# Patient Record
Sex: Female | Born: 1995 | Race: White | Hispanic: No | Marital: Single | Smoking: Current every day smoker
Health system: Southern US, Community
[De-identification: ages and names within clinical notes are randomized; demographics above are authoritative.]

## PROBLEM LIST (undated history)

## (undated) DIAGNOSIS — F4310 Post-traumatic stress disorder, unspecified: Secondary | ICD-10-CM

---
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
## 2013-08-09 ENCOUNTER — Emergency Department
Admission: EM | Admit: 2013-08-09 | Discharge: 2013-08-09 | Discharge status: Absent without leave | Payer: Self-pay | Source: Home / Self Care

## 2014-09-19 IMAGING — CR DG ANKLE COMPLETE 3+V*L*
3 series · 3 of 3 positions shown · non-contrast
Comparison: None.

CLINICAL DATA: Fall with medial ankle pain.

LEFT ANKLE COMPLETE - 3+ VIEW

[view not recorded (1 of 3)]
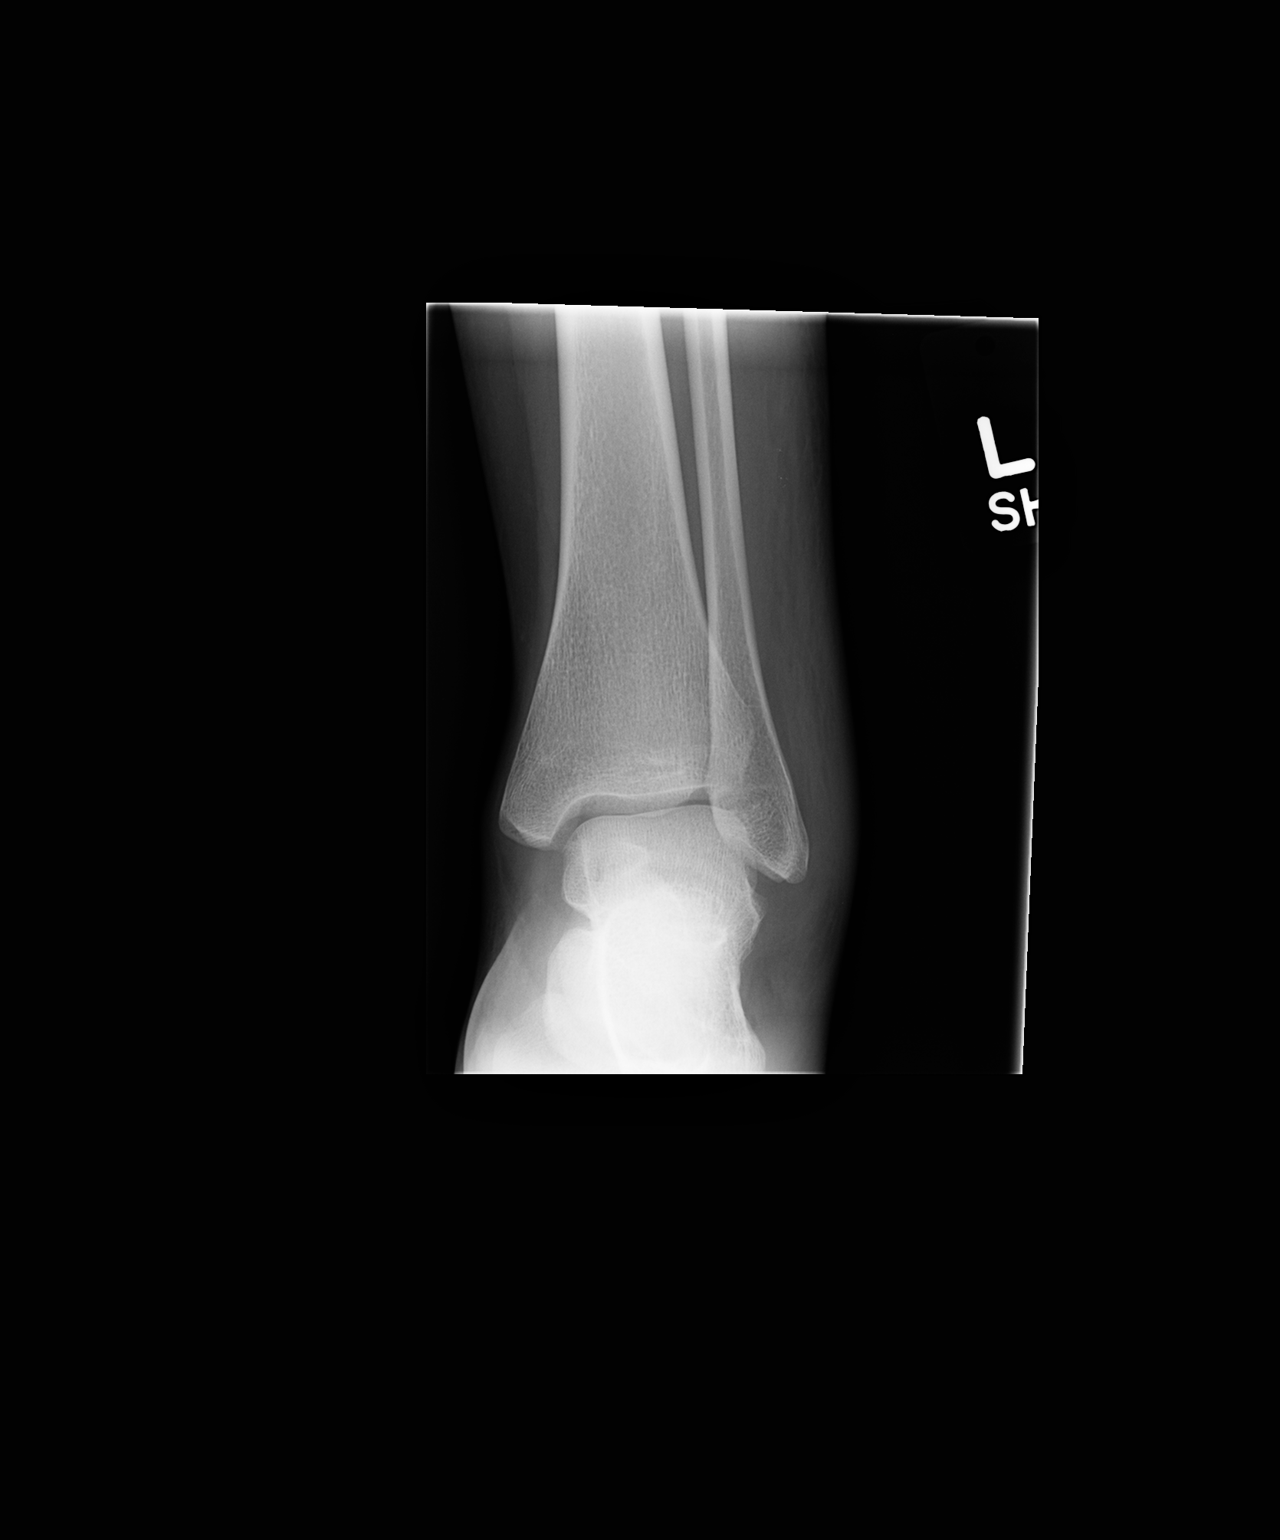

[view not recorded (2 of 3)]
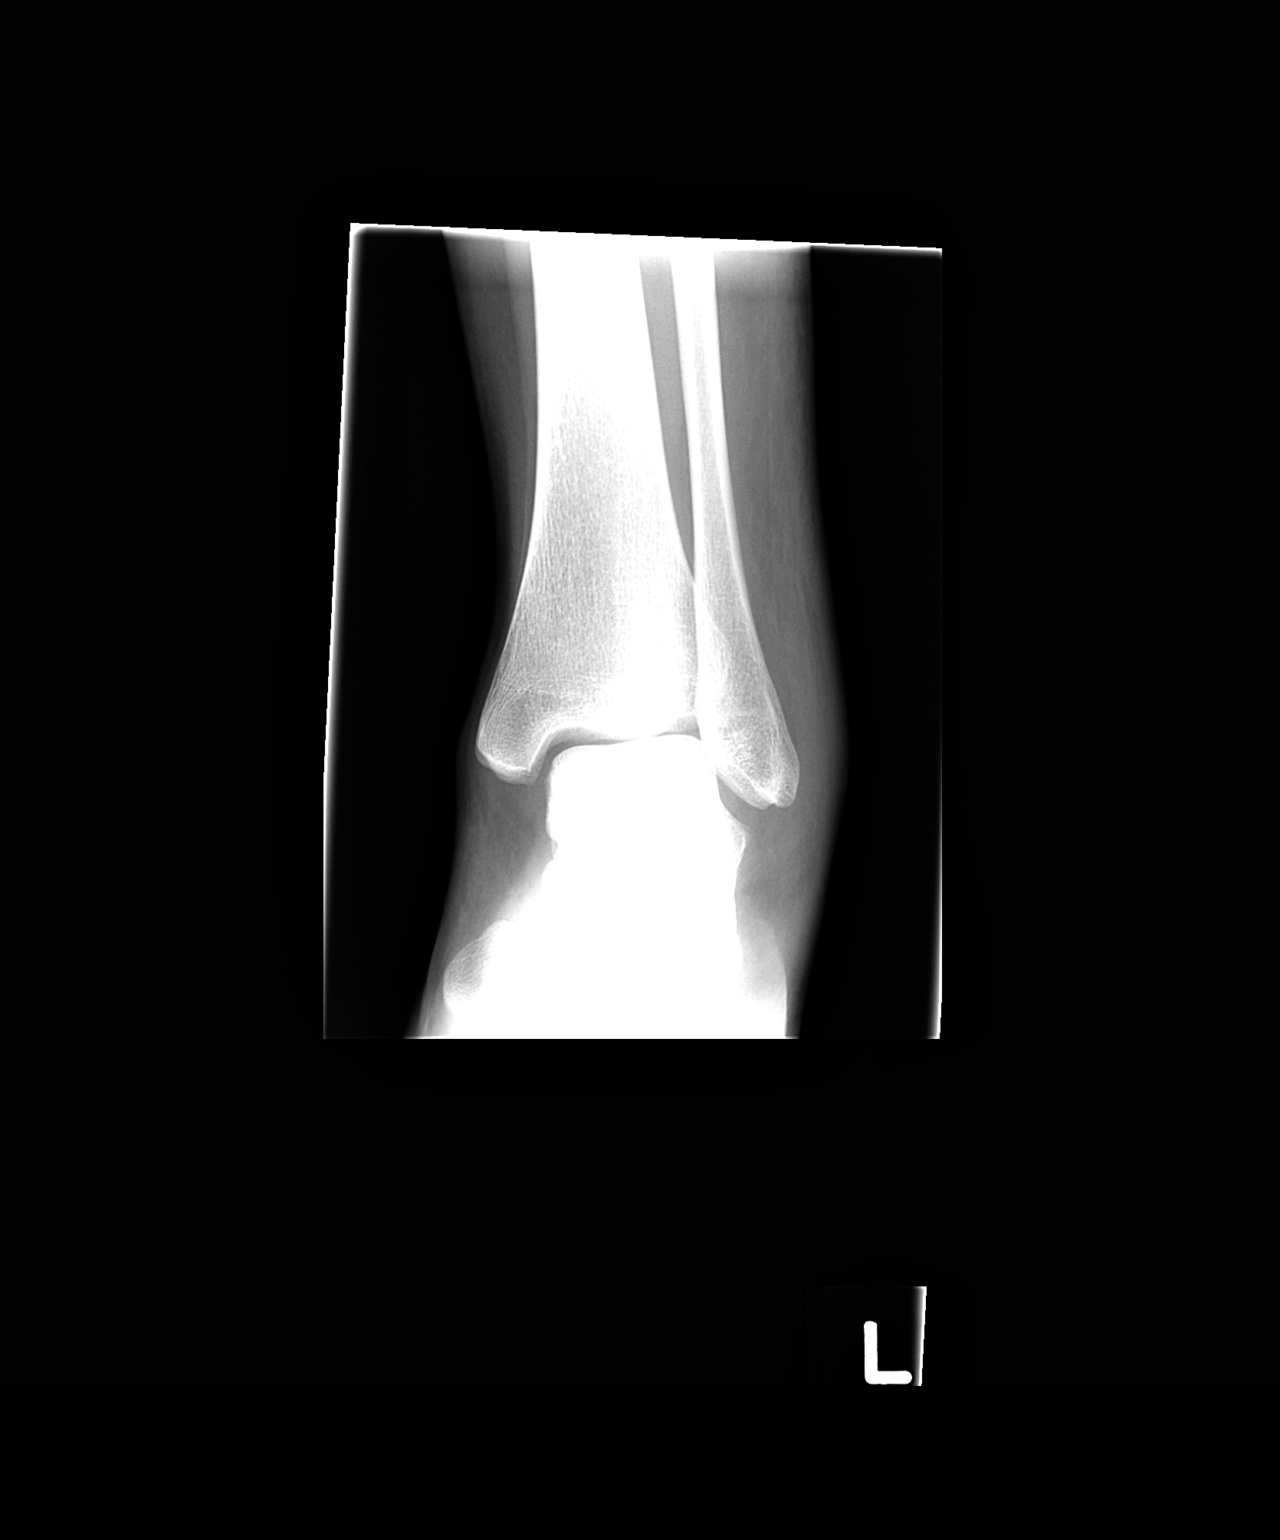

[view not recorded (3 of 3)]
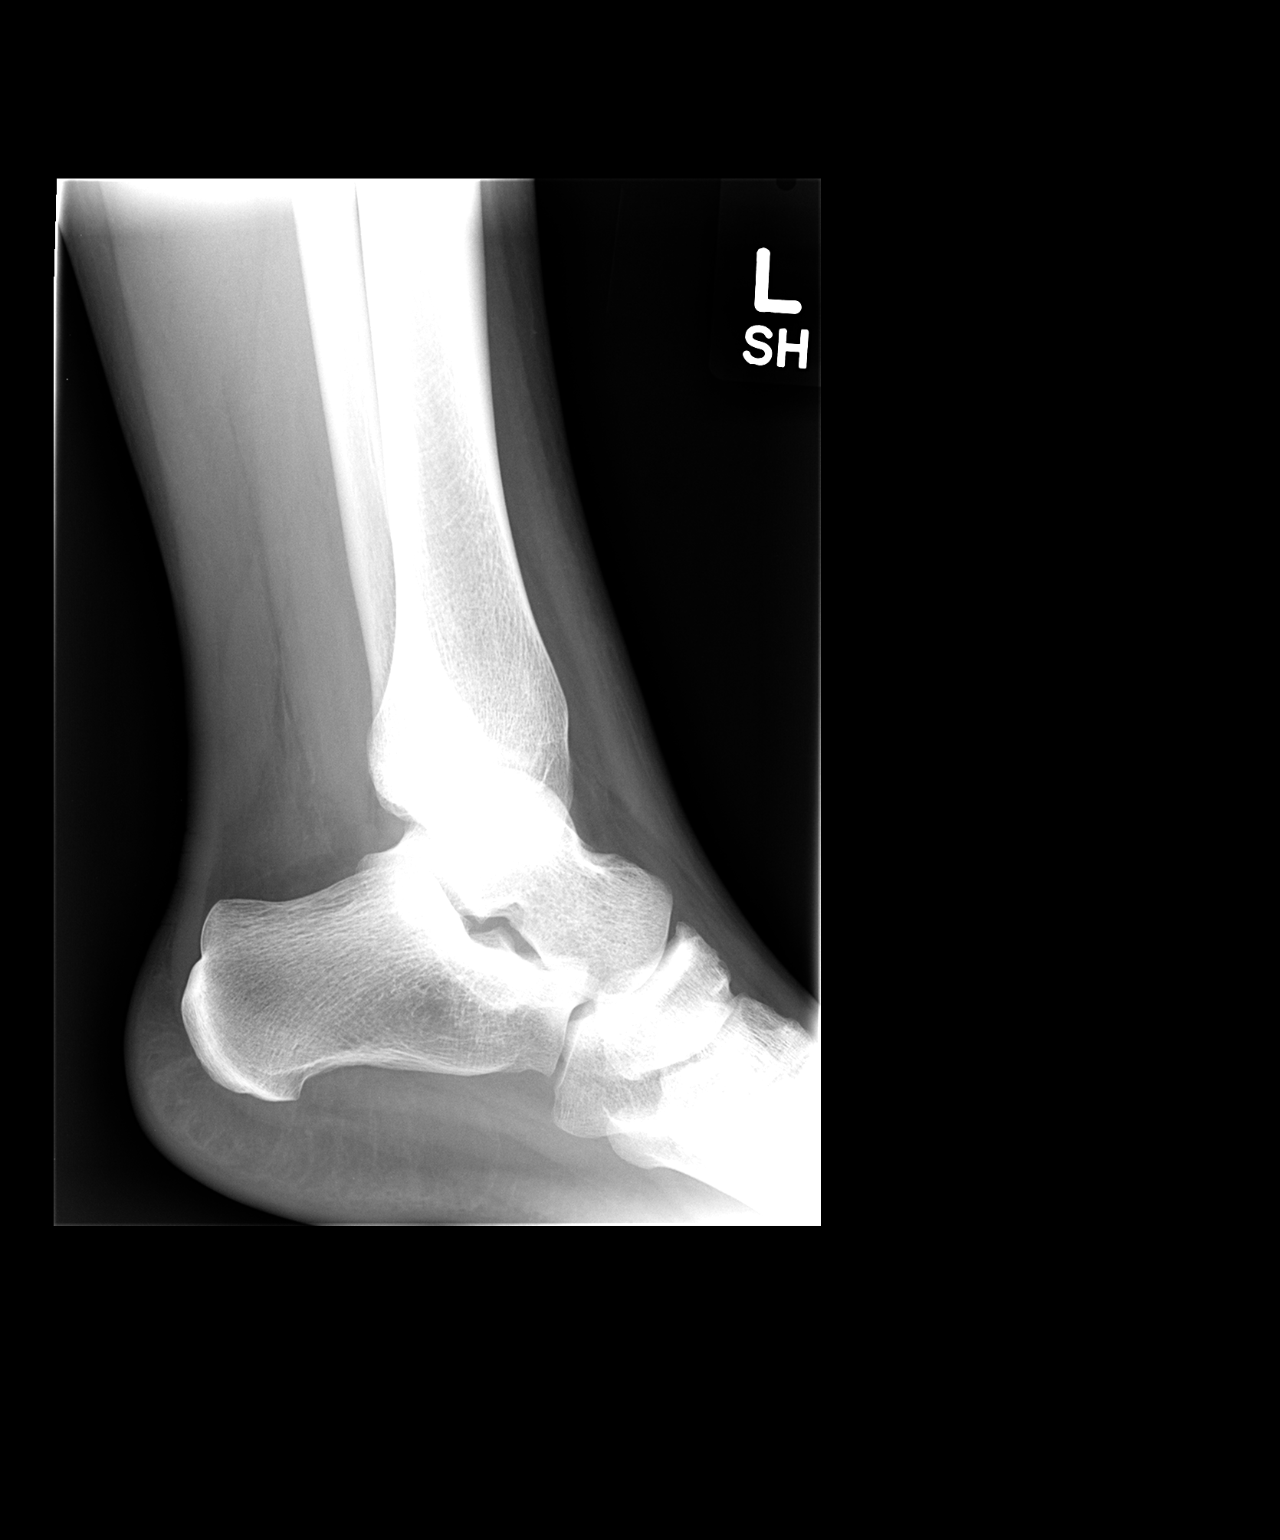

[3 of 3 positions shown; findings below may reference images not displayed]

FINDINGS: There is lateral soft tissue swelling.  No acute osseous
or joint abnormality.
IMPRESSION: Lateral soft tissue swelling without acute osseous or joint
abnormality.

## 2015-11-07 ENCOUNTER — Other Ambulatory Visit: Payer: Self-pay

## 2018-02-25 ENCOUNTER — Telehealth: Payer: Self-pay | Admitting: *Deleted

## 2018-02-25 NOTE — Telephone Encounter (Signed)
Patient's Primary Physician Office called inquiring about the referral that was sent to [place] and if an appointment was made. Informed the office that 2 attempted calls have been made to the patient to schedule an appointment. PCP office stated that they would note that in the patient's chart.

## 2021-09-07 ENCOUNTER — Encounter (HOSPITAL_COMMUNITY): Payer: Self-pay | Admitting: *Deleted

## 2021-09-07 ENCOUNTER — Other Ambulatory Visit: Payer: Self-pay

## 2021-09-07 ENCOUNTER — Emergency Department (HOSPITAL_COMMUNITY)
Admission: EM | Admit: 2021-09-07 | Discharge: 2021-09-07 | Disposition: A | Discharge status: Home / Self Care | Payer: BC Managed Care – PPO | Attending: Emergency Medicine | Admitting: Emergency Medicine

## 2021-09-07 DIAGNOSIS — F1110 Opioid abuse, uncomplicated: Secondary | ICD-10-CM | POA: Diagnosis present

## 2021-09-07 HISTORY — DX: Post-traumatic stress disorder, unspecified: F43.10

## 2021-09-07 LAB — URINALYSIS, ROUTINE W REFLEX MICROSCOPIC
Bilirubin Urine: NEGATIVE
Glucose, UA: NEGATIVE mg/dL
Ketones, ur: NEGATIVE mg/dL
Leukocytes,Ua: NEGATIVE
Nitrite: NEGATIVE
Protein, ur: NEGATIVE mg/dL
Specific Gravity, Urine: 1.019 (ref 1.005–1.030)
pH: 5 (ref 5.0–8.0)

## 2021-09-07 LAB — RAPID URINE DRUG SCREEN, HOSP PERFORMED
Amphetamines: NOT DETECTED
Barbiturates: NOT DETECTED
Benzodiazepines: POSITIVE — AB
Cocaine: NOT DETECTED
Opiates: NOT DETECTED
Tetrahydrocannabinol: NOT DETECTED

## 2021-09-07 LAB — PREGNANCY, URINE: Preg Test, Ur: NEGATIVE

## 2021-09-07 MED ORDER — ONDANSETRON 4 MG PO TBDP: 4.0000 mg | ORAL_TABLET | Freq: Three times a day (TID) | ORAL | 0 refills | Status: DC | PRN

## 2021-09-07 NOTE — ED Triage Notes (Signed)
Pt in states, "Jasmin Conner courts told me to come here for detox on Fentanyl or go to prison, so I came here." Last use of Fentanyl today, pt states, "I snorted it this morning." Pt reports being a part of TASK, pt here voluntary, A&O x4

## 2021-09-07 NOTE — Discharge Instructions (Signed)
I have given you options for both outpatient and residential substance abuse places.  I will also give you a note that you can shows proof that you were seen and evaluated here.

## 2021-09-07 NOTE — ED Provider Notes (Signed)
[facility] EMERGENCY DEPARTMENT Provider Note   CSN: [id number] Arrival date & time: 09/07/21  1355     History Chief Complaint  Patient presents with   Addiction Problem    Ladan Vanderzanden is a 26 y.o. female who presents to the emergency department for detox.  Patient states that she was sent by her task representative for detox to the emergency department.  If she did not come to the emergency department the test representative stated that she will be in violation and will be escorted to prison.  Patient wants detox off fentanyl.  Last currently used this morning.  She denies any somatic complaints at this time.  She denies homicidal ideations or suicidal ideations.  HPI     Home Medications Prior to Admission medications   Medication Sig Start Date End Date Taking? Authorizing Provider  acetaminophen (TYLENOL) 325 MG tablet Take 650 mg by mouth every 6 (six) hours as needed for pain.    [provider]  ibuprofen (ADVIL,MOTRIN) 200 MG tablet Take 200 mg by mouth every 6 (six) hours as needed for pain.    [provider]  ondansetron (ZOFRAN-ODT) 4 MG disintegrating tablet Take 1 tablet (4 mg total) by mouth every 8 (eight) hours as needed for nausea or vomiting. 09/07/21  Yes Honor Loh M, PA-C      Allergies    Codeine and Decadron [dexamethasone]    Review of Systems   Review of Systems  All other systems reviewed and are negative.  Physical Exam Updated Vital Signs BP 109/79   Pulse 82   Temp 98 F (36.7 C) (Oral)   Resp 17   LMP 09/07/2021 Comment: pt states, "I might be pregnant."  SpO2 100%  Physical Exam Vitals and nursing note reviewed.  Constitutional:      General: She is not in acute distress.    Appearance: Normal appearance.  HENT:     Head: Normocephalic and atraumatic.  Eyes:     General:        Right eye: No discharge.        Left eye: No discharge.  Cardiovascular:     Comments: Regular rate and rhythm.  S1/S2 are  distinct without any evidence of murmur, rubs, or gallops.  Radial pulses are 2+ bilaterally.  Dorsalis pedis pulses are 2+ bilaterally.  No evidence of pedal edema. Pulmonary:     Comments: Clear to auscultation bilaterally.  Normal effort.  No respiratory distress.  No evidence of wheezes, rales, or rhonchi heard throughout. Abdominal:     General: Abdomen is flat. Bowel sounds are normal. There is no distension.     Tenderness: There is no abdominal tenderness. There is no guarding or rebound.  Musculoskeletal:        General: Normal range of motion.     Cervical back: Neck supple.  Skin:    General: Skin is warm and dry.     Findings: No rash.  Neurological:     General: No focal deficit present.     Mental Status: She is alert.  Psychiatric:        Mood and Affect: Mood normal.        Behavior: Behavior normal.    ED Results / Procedures / Treatments   Labs (all labs ordered are listed, but only abnormal results are displayed) Labs Reviewed  RAPID URINE DRUG SCREEN, HOSP PERFORMED  URINALYSIS, ROUTINE W REFLEX MICROSCOPIC  PREGNANCY, URINE    EKG None  Radiology No  results found.  Procedures Procedures    Medications Ordered in ED Medications - No data to display  ED Course/ Medical Decision Making/ A&P Clinical Course as of 09/07/21 1501  Fri Sep 07, 2021  1446 I attempted to call the patient's TASK representative twice with no answer. [CF]  1459 Got a hold of the patient's representative and notified her the reason why the patient will be discharged home with outpatient resources.  She will make a note in her chart that she would not be penalized for attempting to the right thing. [CF]    Clinical Course User Index [CF] Teressa Lower, PA-C                           Medical Decision Making Terrel Manalo is a 26 y.o. female who presents to the emergency department today for further evaluation of detox.  Patient states she last used fentanyl earlier  today.  Patient is in no acute distress with totally normal vital signs.  No signs of autonomic dysregulation.  No signs of active withdrawal at this time.  Patient has no psychotic features as well.  I spoken with the patient's representatives at [facility] court system nail and make a note for not to get deemed delinquent.  I have given the patient some Zofran to go home with in addition to resources for outpatient detox facilities.  Strict turn precautions were discussed the patient at the bedside.  She is safe discharge.   Amount and/or Complexity of Data Reviewed Labs: ordered.   Final Clinical Impression(s) / ED Diagnoses Final diagnoses:  Opioid abuse (HCC)    Rx / DC Orders ED Discharge Orders          Ordered    ondansetron (ZOFRAN-ODT) 4 MG disintegrating tablet  Every 8 hours PRN        09/07/21 1450              Honor Loh [place], [place] 09/07/21 1501    Benjiman Core, MD 09/07/21 1535

## 2022-01-25 ENCOUNTER — Encounter (HOSPITAL_COMMUNITY): Payer: Self-pay | Admitting: Emergency Medicine

## 2022-01-25 ENCOUNTER — Emergency Department (HOSPITAL_COMMUNITY)
Admission: EM | Admit: 2022-01-25 | Discharge: 2022-01-25 | Disposition: A | Discharge status: Home / Self Care | Payer: Self-pay | Attending: Emergency Medicine | Admitting: Emergency Medicine

## 2022-01-25 ENCOUNTER — Emergency Department (HOSPITAL_COMMUNITY): Payer: Self-pay

## 2022-01-25 DIAGNOSIS — F17210 Nicotine dependence, cigarettes, uncomplicated: Secondary | ICD-10-CM | POA: Insufficient documentation

## 2022-01-25 DIAGNOSIS — R4781 Slurred speech: Secondary | ICD-10-CM | POA: Insufficient documentation

## 2022-01-25 DIAGNOSIS — Y900 Blood alcohol level of less than 20 mg/100 ml: Secondary | ICD-10-CM | POA: Insufficient documentation

## 2022-01-25 DIAGNOSIS — R451 Restlessness and agitation: Secondary | ICD-10-CM | POA: Insufficient documentation

## 2022-01-25 DIAGNOSIS — T40604A Poisoning by unspecified narcotics, undetermined, initial encounter: Secondary | ICD-10-CM | POA: Insufficient documentation

## 2022-01-25 DIAGNOSIS — R0902 Hypoxemia: Secondary | ICD-10-CM

## 2022-01-25 DIAGNOSIS — J209 Acute bronchitis, unspecified: Secondary | ICD-10-CM | POA: Insufficient documentation

## 2022-01-25 DIAGNOSIS — R400 Somnolence: Secondary | ICD-10-CM | POA: Insufficient documentation

## 2022-01-25 LAB — CBC
HCT: 41.1 % (ref 36.0–46.0)
Hemoglobin: 14.2 g/dL (ref 12.0–15.0)
MCH: 30.5 pg (ref 26.0–34.0)
MCHC: 34.5 g/dL (ref 30.0–36.0)
MCV: 88.2 fL (ref 80.0–100.0)
Platelets: 246 10*3/uL (ref 150–400)
RBC: 4.66 MIL/uL (ref 3.87–5.11)
RDW: 12.1 % (ref 11.5–15.5)
WBC: 3.3 10*3/uL — ABNORMAL LOW (ref 4.0–10.5)
nRBC: 0 % (ref 0.0–0.2)

## 2022-01-25 LAB — COMPREHENSIVE METABOLIC PANEL
ALT: 172 U/L — ABNORMAL HIGH (ref 0–44)
AST: 155 U/L — ABNORMAL HIGH (ref 15–41)
Albumin: 4.2 g/dL (ref 3.5–5.0)
Alkaline Phosphatase: 59 U/L (ref 38–126)
Anion gap: 13 (ref 5–15)
BUN: 20 mg/dL (ref 6–20)
CO2: 25 mmol/L (ref 22–32)
Calcium: 8.8 mg/dL — ABNORMAL LOW (ref 8.9–10.3)
Chloride: 103 mmol/L (ref 98–111)
Creatinine, Ser: 0.86 mg/dL (ref 0.44–1.00)
GFR, Estimated: >60 mL/min (ref >60)
Glucose, Bld: 86 mg/dL (ref 70–99)
Potassium: 3.3 mmol/L — ABNORMAL LOW (ref 3.5–5.1)
Sodium: 141 mmol/L (ref 135–145)
Total Bilirubin: 1.8 mg/dL — ABNORMAL HIGH (ref 0.3–1.2)
Total Protein: 7.5 g/dL (ref 6.5–8.1)

## 2022-01-25 LAB — ETHANOL: Alcohol, Ethyl (B): <10 mg/dL (ref <10)

## 2022-01-25 LAB — I-STAT BETA HCG BLOOD, ED (MC, WL, AP ONLY): I-stat hCG, quantitative: <5 m[IU]/mL (ref <5)

## 2022-01-25 LAB — SALICYLATE LEVEL: Salicylate Lvl: <7 mg/dL — ABNORMAL LOW (ref 7.0–30.0)

## 2022-01-25 LAB — ACETAMINOPHEN LEVEL: Acetaminophen (Tylenol), Serum: <10 ug/mL — ABNORMAL LOW (ref 10–30)

## 2022-01-25 MED ORDER — AZITHROMYCIN 250 MG PO TABS: 250.0000 mg | ORAL_TABLET | Freq: Every day | ORAL | 0 refills | Status: DC

## 2022-01-25 MED ORDER — ALBUTEROL SULFATE HFA 108 (90 BASE) MCG/ACT IN AERS
1.0000 | INHALATION_SPRAY | Freq: Four times a day (QID) | RESPIRATORY_TRACT | 0 refills | Status: DC | PRN
Start: 2022-01-25 — End: 2022-02-25

## 2022-01-25 MED ORDER — ALBUTEROL SULFATE HFA 108 (90 BASE) MCG/ACT IN AERS: 1.0000 | INHALATION_SPRAY | Freq: Four times a day (QID) | RESPIRATORY_TRACT | 0 refills | Status: DC | PRN

## 2022-01-25 MED ORDER — SODIUM CHLORIDE 0.9 % IV BOLUS
1000.0000 mL | Freq: Once | INTRAVENOUS | Status: AC
  Administered 2022-01-25: 1000 mL via INTRAVENOUS
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
## 2022-01-25 NOTE — ED Notes (Signed)
Pt drinking water and tolerating well.  

## 2022-01-25 NOTE — ED Notes (Signed)
when nurse entered room pt was not on oxygen, pt had pulled off [place], noted pt O2 sats were 85% on RA- DR Sedonia Small made aware. PT placed back on [place] and tape applied to secure tubing- O2 sats improved to 94% within a minute.
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
## 2022-01-25 NOTE — ED Notes (Signed)
Pt alert and oriented x 4 at this time, requesting something to drink, pt "tweeking" coming down off meth,  pt having involuntary muscle movements and says this is what happens when she is "coming down off meth" Pt given water per DR Sedonia Small ok.

## 2022-01-25 NOTE — ED Notes (Addendum)
Unable to obtain tubes for labs from IV 22 g- lab made aware pt needs blood drawn

## 2022-01-25 NOTE — ED Notes (Signed)
Pt requested strict confidentiality. Registration contacted.

## 2022-01-25 NOTE — ED Notes (Signed)
Boyfriend now in the room with the patient. Pt is awake.

## 2022-01-25 NOTE — ED Triage Notes (Addendum)
Pt BIB [facility] from home after accidentally overdose on meth and something else. Pt given 8mg  narcan by first responders. EMS states she found out that she is pregnant today. Pt recently released from jail.

## 2022-01-25 NOTE — ED Provider Notes (Signed)
[facility]-EMERGENCY DEPT [facility] Emergency Department Provider Note MRN:  [id number]  Arrival date & time: 01/25/22     Chief Complaint   Drug Overdose   History of Present Illness   Jasmin Conner is a 26 y.o. year-old female with a history of PTSD presenting to the ED with chief complaint of drug overdose.  EMS called for drug overdose, patient given 2 doses of Narcan and then woke up.  Became combative.  Patient thinks she may be pregnant, positive pregnancy test today.  Just got out of prison.  Review of Systems  A thorough review of systems was obtained and all systems are negative except as noted in the HPI and PMH.   Patient's Health History    Past Medical History:  Diagnosis Date   PTSD (post-traumatic stress disorder)     History reviewed. No pertinent surgical history.  History reviewed. No pertinent family history.  Social History   Socioeconomic History   Marital status: Single    Spouse name: Not on file   Number of children: Not on file   Years of education: Not on file   Highest education level: Not on file  Occupational History   Not on file  Tobacco Use   Smoking status: Every Day    Packs/day: 1.00    Years: 4.00    Total pack years: 4.00    Types: Cigarettes   Smokeless tobacco: Not on file  Substance and Sexual Activity   Alcohol use: No   Drug use: Yes    Types: Methamphetamines    Comment: pt reports snorting Fentanyl 09/07/21   Sexual activity: Yes  Other Topics Concern   Not on file  Social History Narrative   Not on file   Social Determinants of Health   Financial Resource Strain: Not on file  Food Insecurity: Not on file  Transportation Needs: Not on file  Physical Activity: Not on file  Stress: Not on file  Social Connections: Not on file  Intimate Partner Violence: Not on file     Physical Exam   Vitals:   01/25/22 0600 01/25/22 0630  BP: (!) 109/58 109/76  Pulse: 74 94  Resp: 16 20  Temp:    SpO2: 99% 95%     CONSTITUTIONAL: Chronically ill-appearing, NAD NEURO/PSYCH: Somnolent, speech is slurred, confused, moves all extremities EYES:  eyes equal and reactive ENT/NECK:  no LAD, no JVD CARDIO: Regular rate, well-perfused, normal S1 and S2 PULM:  CTAB no wheezing or rhonchi GI/GU:  non-distended, non-tender MSK/SPINE:  No gross deformities, no edema SKIN:  no rash, atraumatic   *Additional and/or pertinent findings included in MDM below  Diagnostic and Interventional Summary    EKG Interpretation  Date/Time:  Friday January 25 2022 01:01:19 EDT Ventricular Rate:  79 PR Interval:  156 QRS Duration: 108 QT Interval:  394 QTC Calculation: 449 R Axis:   30 Text Interpretation: Sinus arrhythmia Probable left atrial enlargement RSR' in V1 or V2, right VCD or RVH Confirmed by Kennis Carina [phone]) on 01/25/2022 2:15:39 AM       Labs Reviewed  CBC - Abnormal; Notable for the following components:      Result Value   WBC 3.3 (*)    All other components within normal limits  COMPREHENSIVE METABOLIC PANEL - Abnormal; Notable for the following components:   Potassium 3.3 (*)    Calcium 8.8 (*)    AST 155 (*)    ALT 172 (*)    Total Bilirubin 1.8 (*)  All other components within normal limits  ACETAMINOPHEN LEVEL - Abnormal; Notable for the following components:   Acetaminophen (Tylenol), Serum <10 (*)    All other components within normal limits  SALICYLATE LEVEL - Abnormal; Notable for the following components:   Salicylate Lvl <1.6 (*)    All other components within normal limits  ETHANOL  I-STAT BETA HCG BLOOD, ED ([facility], [facility], [facility] ONLY)    DG Chest Port 1 View  Final Result      Medications  sodium chloride 0.9 % bolus 1,000 mL (0 mLs Intravenous Stopped 01/25/22 0334)     Procedures  /  Critical Care Procedures  ED Course and Medical Decision Making  Initial Impression and Ddx Reportedly patient thought she was taking methamphetamine, suspect unintentional fentanyl  overdose, will need close monitoring for resedation.  This is complicated by possible pregnancy per patient, awaiting hCG.  Past medical/surgical history that increases complexity of ED encounter: Substance use disorder  Interpretation of Diagnostics I personally reviewed the laboratory assessment and my interpretation is as follows: No significant blood count or electrolyte disturbance.  hCG is negative, Tylenol salicylate negative.    Patient Reassessment and Ultimate Disposition/Management     Patient still obviously under the influence of drugs and not sober enough for discharge, signed out to oncoming provider.  Patient management required discussion with the following services or consulting groups:  None  Complexity of Problems Addressed Acute illness or injury that poses threat of life of bodily function  Additional Data Reviewed and Analyzed Further history obtained from: EMS on arrival  Additional Factors Impacting ED Encounter Risk None  Barth Kirks. Sedonia Small, [place] [email or web address]  Final Clinical Impressions(s) / ED Diagnoses     ICD-10-CM   1. Opiate overdose, undetermined intent, initial encounter [facility])  [place]       ED Discharge Orders     None        Discharge Instructions Discussed with and Provided to Patient:   Discharge Instructions   None      Maudie Flakes, MD 01/25/22 [phone]

## 2022-02-19 ENCOUNTER — Other Ambulatory Visit: Payer: Self-pay

## 2022-02-19 ENCOUNTER — Emergency Department (HOSPITAL_COMMUNITY): Payer: No Typology Code available for payment source

## 2022-02-19 ENCOUNTER — Inpatient Hospital Stay (HOSPITAL_COMMUNITY)
Admission: EM | Admit: 2022-02-19 | Discharge: 2022-02-25 | DRG: 896 | Disposition: A | Discharge status: Home / Self Care | Payer: No Typology Code available for payment source | Attending: Internal Medicine | Admitting: Internal Medicine

## 2022-02-19 ENCOUNTER — Encounter (HOSPITAL_COMMUNITY): Payer: Self-pay | Admitting: *Deleted

## 2022-02-19 DIAGNOSIS — T7621XA Adult sexual abuse, suspected, initial encounter: Secondary | ICD-10-CM | POA: Diagnosis present

## 2022-02-19 DIAGNOSIS — X58XXXA Exposure to other specified factors, initial encounter: Secondary | ICD-10-CM | POA: Diagnosis present

## 2022-02-19 DIAGNOSIS — F17210 Nicotine dependence, cigarettes, uncomplicated: Secondary | ICD-10-CM | POA: Diagnosis present

## 2022-02-19 DIAGNOSIS — M96A3 Multiple fractures of ribs associated with chest compression and cardiopulmonary resuscitation: Secondary | ICD-10-CM | POA: Diagnosis present

## 2022-02-19 DIAGNOSIS — I2690 Septic pulmonary embolism without acute cor pulmonale: Secondary | ICD-10-CM

## 2022-02-19 DIAGNOSIS — R4182 Altered mental status, unspecified: Secondary | ICD-10-CM | POA: Diagnosis not present

## 2022-02-19 DIAGNOSIS — E876 Hypokalemia: Secondary | ICD-10-CM | POA: Diagnosis present

## 2022-02-19 DIAGNOSIS — S2249XA Multiple fractures of ribs, unspecified side, initial encounter for closed fracture: Secondary | ICD-10-CM | POA: Diagnosis present

## 2022-02-19 DIAGNOSIS — G9341 Metabolic encephalopathy: Secondary | ICD-10-CM | POA: Diagnosis present

## 2022-02-19 DIAGNOSIS — G249 Dystonia, unspecified: Secondary | ICD-10-CM | POA: Diagnosis present

## 2022-02-19 DIAGNOSIS — Z885 Allergy status to narcotic agent status: Secondary | ICD-10-CM | POA: Diagnosis not present

## 2022-02-19 DIAGNOSIS — Z23 Encounter for immunization: Secondary | ICD-10-CM

## 2022-02-19 DIAGNOSIS — N390 Urinary tract infection, site not specified: Secondary | ICD-10-CM | POA: Diagnosis present

## 2022-02-19 DIAGNOSIS — I76 Septic arterial embolism: Secondary | ICD-10-CM | POA: Diagnosis present

## 2022-02-19 DIAGNOSIS — M5126 Other intervertebral disc displacement, lumbar region: Secondary | ICD-10-CM | POA: Diagnosis present

## 2022-02-19 DIAGNOSIS — S2241XA Multiple fractures of ribs, right side, initial encounter for closed fracture: Secondary | ICD-10-CM | POA: Diagnosis present

## 2022-02-19 DIAGNOSIS — Z888 Allergy status to other drugs, medicaments and biological substances status: Secondary | ICD-10-CM

## 2022-02-19 DIAGNOSIS — F1910 Other psychoactive substance abuse, uncomplicated: Secondary | ICD-10-CM | POA: Diagnosis present

## 2022-02-19 DIAGNOSIS — F1510 Other stimulant abuse, uncomplicated: Secondary | ICD-10-CM | POA: Diagnosis present

## 2022-02-19 DIAGNOSIS — F4310 Post-traumatic stress disorder, unspecified: Secondary | ICD-10-CM | POA: Diagnosis present

## 2022-02-19 DIAGNOSIS — F1110 Opioid abuse, uncomplicated: Principal | ICD-10-CM | POA: Diagnosis present

## 2022-02-19 DIAGNOSIS — S2220XA Unspecified fracture of sternum, initial encounter for closed fracture: Secondary | ICD-10-CM | POA: Diagnosis present

## 2022-02-19 DIAGNOSIS — I38 Endocarditis, valve unspecified: Secondary | ICD-10-CM

## 2022-02-19 DIAGNOSIS — N3000 Acute cystitis without hematuria: Secondary | ICD-10-CM

## 2022-02-19 LAB — URINALYSIS, ROUTINE W REFLEX MICROSCOPIC
Bilirubin Urine: NEGATIVE
Glucose, UA: NEGATIVE mg/dL
Ketones, ur: 20 mg/dL — AB
Nitrite: NEGATIVE
Protein, ur: 30 mg/dL — AB
Specific Gravity, Urine: >1.046 — ABNORMAL HIGH (ref 1.005–1.030)
pH: 6 (ref 5.0–8.0)

## 2022-02-19 LAB — CBC WITH DIFFERENTIAL/PLATELET
Abs Immature Granulocytes: 0.05 10*3/uL (ref 0.00–0.07)
Basophils Absolute: 0 10*3/uL (ref 0.0–0.1)
Basophils Relative: 0 %
Eosinophils Absolute: 0.1 10*3/uL (ref 0.0–0.5)
Eosinophils Relative: 1 %
HCT: 33 % — ABNORMAL LOW (ref 36.0–46.0)
Hemoglobin: 11.3 g/dL — ABNORMAL LOW (ref 12.0–15.0)
Immature Granulocytes: 1 %
Lymphocytes Relative: 10 %
Lymphs Abs: 0.9 10*3/uL (ref 0.7–4.0)
MCH: 30.1 pg (ref 26.0–34.0)
MCHC: 34.2 g/dL (ref 30.0–36.0)
MCV: 88 fL (ref 80.0–100.0)
Monocytes Absolute: 0.7 10*3/uL (ref 0.1–1.0)
Monocytes Relative: 8 %
Neutro Abs: 7.3 10*3/uL (ref 1.7–7.7)
Neutrophils Relative %: 80 %
Platelets: 299 10*3/uL (ref 150–400)
RBC: 3.75 MIL/uL — ABNORMAL LOW (ref 3.87–5.11)
RDW: 11.8 % (ref 11.5–15.5)
WBC: 9.1 10*3/uL (ref 4.0–10.5)
nRBC: 0 % (ref 0.0–0.2)

## 2022-02-19 LAB — COMPREHENSIVE METABOLIC PANEL
ALT: 21 U/L (ref 0–44)
AST: 26 U/L (ref 15–41)
Albumin: 4 g/dL (ref 3.5–5.0)
Alkaline Phosphatase: 63 U/L (ref 38–126)
Anion gap: 8 (ref 5–15)
BUN: 11 mg/dL (ref 6–20)
CO2: 29 mmol/L (ref 22–32)
Calcium: 9.3 mg/dL (ref 8.9–10.3)
Chloride: 99 mmol/L (ref 98–111)
Creatinine, Ser: 0.71 mg/dL (ref 0.44–1.00)
GFR, Estimated: >60 mL/min (ref >60)
Glucose, Bld: 84 mg/dL (ref 70–99)
Potassium: 3.3 mmol/L — ABNORMAL LOW (ref 3.5–5.1)
Sodium: 136 mmol/L (ref 135–145)
Total Bilirubin: 0.9 mg/dL (ref 0.3–1.2)
Total Protein: 7.6 g/dL (ref 6.5–8.1)

## 2022-02-19 LAB — RAPID URINE DRUG SCREEN, HOSP PERFORMED
Amphetamines: POSITIVE — AB
Barbiturates: NOT DETECTED
Benzodiazepines: NOT DETECTED
Cocaine: NOT DETECTED
Opiates: POSITIVE — AB
Tetrahydrocannabinol: NOT DETECTED

## 2022-02-19 LAB — ECHOCARDIOGRAM COMPLETE
Area-P 1/2: 4.6 cm2
Height: 66 in
S' Lateral: 2.9 cm
Weight: 1940.05 oz

## 2022-02-19 LAB — I-STAT BETA HCG BLOOD, ED (MC, WL, AP ONLY): I-stat hCG, quantitative: <5 m[IU]/mL (ref <5)

## 2022-02-19 MED ORDER — POTASSIUM CHLORIDE IN NACL 40-0.9 MEQ/L-% IV SOLN
INTRAVENOUS | Status: AC
  Filled 2022-02-19 (×4): qty 1000

## 2022-02-19 MED ORDER — ACETAMINOPHEN 650 MG RE SUPP: 650.0000 mg | Freq: Four times a day (QID) | RECTAL | Status: DC | PRN

## 2022-02-19 MED ORDER — IBUPROFEN 400 MG PO TABS
600.0000 mg | ORAL_TABLET | Freq: Three times a day (TID) | ORAL | Status: AC
  Administered 2022-02-20 (×2): 600 mg via ORAL
  Filled 2022-02-19 (×2): qty 2

## 2022-02-19 MED ORDER — ACETAMINOPHEN 325 MG PO TABS
650.0000 mg | ORAL_TABLET | Freq: Four times a day (QID) | ORAL | Status: DC | PRN
  Administered 2022-02-20 – 2022-02-24 (×6): 650 mg via ORAL
  Filled 2022-02-19 (×7): qty 2

## 2022-02-19 MED ORDER — LORAZEPAM 0.5 MG PO TABS
0.5000 mg | ORAL_TABLET | ORAL | Status: DC | PRN
  Administered 2022-02-20 – 2022-02-25 (×26): 0.5 mg via ORAL
  Filled 2022-02-19 (×29): qty 1

## 2022-02-19 MED ORDER — ONDANSETRON HCL 4 MG PO TABS
4.0000 mg | ORAL_TABLET | Freq: Four times a day (QID) | ORAL | Status: DC | PRN
  Filled 2022-02-19: qty 1

## 2022-02-19 MED ORDER — SODIUM CHLORIDE 0.9 % IV SOLN
2.0000 g | INTRAVENOUS | Status: DC
  Administered 2022-02-19 – 2022-02-24 (×6): 2 g via INTRAVENOUS
  Filled 2022-02-19 (×6): qty 20

## 2022-02-19 MED ORDER — ENOXAPARIN SODIUM 40 MG/0.4ML IJ SOSY
40.0000 mg | PREFILLED_SYRINGE | INTRAMUSCULAR | Status: DC
  Administered 2022-02-20 – 2022-02-25 (×5): 40 mg via SUBCUTANEOUS
  Filled 2022-02-19 (×6): qty 0.4

## 2022-02-19 MED ORDER — ONDANSETRON HCL 4 MG PO TABS: 4.0000 mg | ORAL_TABLET | Freq: Three times a day (TID) | ORAL | Status: DC | PRN

## 2022-02-19 MED ORDER — LORAZEPAM 1 MG PO TABS
1.0000 mg | ORAL_TABLET | Freq: Four times a day (QID) | ORAL | Status: DC | PRN
  Administered 2022-02-19: 1 mg via ORAL
  Filled 2022-02-19: qty 1

## 2022-02-19 MED ORDER — IOHEXOL 300 MG/ML  SOLN
100.0000 mL | Freq: Once | INTRAMUSCULAR | Status: AC | PRN
  Administered 2022-02-19: 80 mL via INTRAVENOUS

## 2022-02-19 MED ORDER — VANCOMYCIN HCL 750 MG/150ML IV SOLN
750.0000 mg | Freq: Two times a day (BID) | INTRAVENOUS | Status: DC
  Administered 2022-02-19 – 2022-02-21 (×5): 750 mg via INTRAVENOUS
  Filled 2022-02-19 (×7): qty 150

## 2022-02-19 MED ORDER — POLYETHYLENE GLYCOL 3350 17 G PO PACK: 17.0000 g | PACK | Freq: Every day | ORAL | Status: DC | PRN

## 2022-02-19 MED ORDER — ONDANSETRON HCL 4 MG/2ML IJ SOLN
4.0000 mg | Freq: Four times a day (QID) | INTRAMUSCULAR | Status: DC | PRN
  Filled 2022-02-19: qty 2

## 2022-02-19 MED ORDER — ACETAMINOPHEN 500 MG PO TABS
500.0000 mg | ORAL_TABLET | Freq: Four times a day (QID) | ORAL | Status: DC | PRN
  Administered 2022-02-19: 500 mg via ORAL
  Filled 2022-02-19: qty 1
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
## 2022-02-19 NOTE — Assessment & Plan Note (Addendum)
Secondary to CPR done when patient coded 02-06-23.  CT showing moderately displaced fractures involving the anterior portions of the right third and fourth ribs, and subacute minimally displaced sternal fracture. -Ibuprofen 600 q8 hourly
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
## 2022-02-19 NOTE — Assessment & Plan Note (Addendum)
Drug of choice, IV fentanyl, and amphetamine.  Smokes cigarettes.  Reports occasional alcohol use.  Currently mildly agitated. -IV Ativan 0.5 mg every 4 hourly as needed -Check HIV, hepatitis C

## 2022-02-19 NOTE — Progress Notes (Signed)
Pharmacy Antibiotic Note  Jasmin Conner is a 26 y.o. female admitted on 02/19/2022 with  r/o septic emboli .  Pharmacy has been consulted for vanc dosing.  Pt with know hx of drug abuse. She presented with AMS and sternal pain. CT showed possible septic emboli. Will start empiric vanc/ceftriaxone.  Scr 0.71  Plan: Vanc 750mg  IV q12>>AUC 470, scr 0.8 Ceftriaxone 2g IV q12 Levels as needed  Height: 5\' 6"  (167.6 cm) Weight: 55 kg (121 lb 4.1 oz) IBW/kg (Calculated) : 59.3  Temp (24hrs), Avg:97.8 F (36.6 C), Min:97.8 F (36.6 C), Max:97.8 F (36.6 C)  Recent Labs  Lab 02/19/22 1235  WBC 9.1  CREATININE 0.71    Estimated Creatinine Clearance: 91.7 mL/min (by C-G formula based on SCr of 0.71 mg/dL).    Allergies  Allergen Reactions   Codeine    Decadron [Dexamethasone]     Antimicrobials this admission: 10/31 vanc>> 10/31 ceftriaxone>>  Dose adjustments this admission:   Microbiology results: 11/31 blood>>  Onnie Boer, PharmD, Pablo Pena, AAHIVP, CPP Infectious Disease Pharmacist 02/19/2022 7:38 PM

## 2022-02-19 NOTE — Assessment & Plan Note (Signed)
Potassium 3.3.  Check magnesium 

## 2022-02-19 NOTE — ED Notes (Signed)
Dr Emokpae at bedside.  

## 2022-02-19 NOTE — Assessment & Plan Note (Addendum)
Reports urinary symptoms, UA consistent with UTI with moderate leukocytes.  Rules out for sepsis. -Add on urine cultures -IV Vanco and ceftriaxone

## 2022-02-19 NOTE — ED Notes (Signed)
Mother states patient has warrants for her arrest and the best thing for her is to be locked up. Mother states "I want her locked up because I can't handle her. I just can't handle her and I know if they lock her up it will be for a few months and she will get clean. She did last time for a few months."

## 2022-02-19 NOTE — ED Notes (Signed)
Patient transported to CT 

## 2022-02-19 NOTE — Progress Notes (Signed)
*  PRELIMINARY RESULTS* Echocardiogram 2D Echocardiogram has been performed.  Jasmin Conner 02/19/2022, 4:21 PM
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
## 2022-02-19 NOTE — Assessment & Plan Note (Signed)
Visual and auditory hallucinations.  Head CT negative for acute abnormality.  Likely secondary to substance abuse.  No prior psych history.

## 2022-02-19 NOTE — ED Notes (Signed)
ED Provider at bedside. 
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
## 2022-02-19 NOTE — Assessment & Plan Note (Addendum)
History of IV drug use, amphetamine and fentanyl.  CT abdomen chest and pelvis showing multiple small nodular densities throughout both lungs-atypical infection or possibly septic emboli.  She is tachycardic, heart rate 78-108, WBC 9.1.  She rules out for sepsis.  Tachycardia may be secondary to substance withdrawal. -Echo done showing mobile echodensity on the ventricular side of mitral valve, cannot exclude vegetations.  It would not explain possible septic emboli, TEE recommended if suspicion for endocarditis especially with bacteremia. -Head CT negative for acute abnormality. - EDP talked to Dr. Baxter Flattery, recommended admission, will see in consult. -Will place cardiology consult for TEE in the morning -IV vancomycin and ceftriaxone -Follow-up chest imaging on discharge to ensure resolution and stability - N/s + 40 KCL 100cc/hr x 20hrs -N.p.o. midnight
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
## 2022-02-20 DIAGNOSIS — I2690 Septic pulmonary embolism without acute cor pulmonale: Secondary | ICD-10-CM

## 2022-02-20 LAB — BASIC METABOLIC PANEL
Anion gap: 5 (ref 5–15)
BUN: 9 mg/dL (ref 6–20)
CO2: 29 mmol/L (ref 22–32)
Calcium: 7.9 mg/dL — ABNORMAL LOW (ref 8.9–10.3)
Chloride: 103 mmol/L (ref 98–111)
Creatinine, Ser: 0.65 mg/dL (ref 0.44–1.00)
GFR, Estimated: >60 mL/min (ref >60)
Glucose, Bld: 104 mg/dL — ABNORMAL HIGH (ref 70–99)
Potassium: 3.4 mmol/L — ABNORMAL LOW (ref 3.5–5.1)
Sodium: 137 mmol/L (ref 135–145)

## 2022-02-20 LAB — CBC
HCT: 29.3 % — ABNORMAL LOW (ref 36.0–46.0)
Hemoglobin: 9.8 g/dL — ABNORMAL LOW (ref 12.0–15.0)
MCH: 30.1 pg (ref 26.0–34.0)
MCHC: 33.4 g/dL (ref 30.0–36.0)
MCV: 89.9 fL (ref 80.0–100.0)
Platelets: 253 10*3/uL (ref 150–400)
RBC: 3.26 MIL/uL — ABNORMAL LOW (ref 3.87–5.11)
RDW: 11.7 % (ref 11.5–15.5)
WBC: 7.5 10*3/uL (ref 4.0–10.5)
nRBC: 0 % (ref 0.0–0.2)

## 2022-02-20 LAB — HEPATITIS PANEL, ACUTE
HCV Ab: REACTIVE — AB
Hep A IgM: NONREACTIVE
Hep B C IgM: NONREACTIVE
Hepatitis B Surface Ag: NONREACTIVE

## 2022-02-20 LAB — PROCALCITONIN: Procalcitonin: 0.92 ng/mL

## 2022-02-20 LAB — HIV ANTIBODY (ROUTINE TESTING W REFLEX): HIV Screen 4th Generation wRfx: NONREACTIVE

## 2022-02-20 LAB — MRSA NEXT GEN BY PCR, NASAL: MRSA by PCR Next Gen: NOT DETECTED

## 2022-02-20 LAB — MAGNESIUM: Magnesium: 1.5 mg/dL — ABNORMAL LOW (ref 1.7–2.4)

## 2022-02-20 MED ORDER — CHLORHEXIDINE GLUCONATE CLOTH 2 % EX PADS
6.0000 | MEDICATED_PAD | Freq: Every day | CUTANEOUS | Status: DC
  Administered 2022-02-20 – 2022-02-23 (×4): 6 via TOPICAL

## 2022-02-20 MED ORDER — EMTRICITABINE-TENOFOVIR AF 200-25 MG PO TABS
1.0000 | ORAL_TABLET | Freq: Every day | ORAL | Status: DC
  Administered 2022-02-20 – 2022-02-22 (×3): 1 via ORAL
  Filled 2022-02-20 (×7): qty 1

## 2022-02-20 MED ORDER — POTASSIUM CHLORIDE CRYS ER 20 MEQ PO TBCR
20.0000 meq | EXTENDED_RELEASE_TABLET | Freq: Once | ORAL | Status: AC
  Administered 2022-02-20: 20 meq via ORAL
  Filled 2022-02-20: qty 1

## 2022-02-20 MED ORDER — DOLUTEGRAVIR SODIUM 50 MG PO TABS
50.0000 mg | ORAL_TABLET | Freq: Every day | ORAL | Status: DC
  Administered 2022-02-20 – 2022-02-22 (×3): 50 mg via ORAL
  Filled 2022-02-20 (×6): qty 1

## 2022-02-20 MED ORDER — MAGNESIUM SULFATE 2 GM/50ML IV SOLN
2.0000 g | Freq: Once | INTRAVENOUS | Status: AC
  Administered 2022-02-20: 2 g via INTRAVENOUS
  Filled 2022-02-20: qty 50

## 2022-02-20 MED ORDER — HEPATITIS A VACCINE 1440 EL U/ML IM SUSP
1.0000 mL | Freq: Once | INTRAMUSCULAR | Status: AC
  Administered 2022-02-21: 1440 [IU] via INTRAMUSCULAR
  Filled 2022-02-20: qty 1

## 2022-02-20 MED ORDER — NICOTINE 14 MG/24HR TD PT24
14.0000 mg | MEDICATED_PATCH | Freq: Every day | TRANSDERMAL | Status: DC
  Administered 2022-02-20 – 2022-02-25 (×6): 14 mg via TRANSDERMAL
  Filled 2022-02-20 (×6): qty 1

## 2022-02-20 MED ORDER — HEPATITIS B VAC RECOMBINANT 10 MCG/ML IJ SUSP
1.0000 mL | Freq: Once | INTRAMUSCULAR | Status: AC
  Administered 2022-02-21: 10 ug via INTRAMUSCULAR
  Filled 2022-02-20: qty 1

## 2022-02-20 MED ORDER — ULIPRISTAL ACETATE 30 MG PO TABS
30.0000 mg | ORAL_TABLET | Freq: Once | ORAL | Status: AC
  Administered 2022-02-20: 30 mg via ORAL
  Filled 2022-02-20: qty 1

## 2022-02-20 MED ORDER — DOXYCYCLINE HYCLATE 100 MG PO TABS
200.0000 mg | ORAL_TABLET | Freq: Once | ORAL | Status: AC
  Administered 2022-02-20: 200 mg via ORAL
  Filled 2022-02-20: qty 2
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
## 2022-02-20 NOTE — Hospital Course (Addendum)
26 year old female with a history of PTSD, intravenous drug use, tobacco abuse presenting with auditory and visual hallucinations.  The patient is also confused.  She is not a good historian.  History is supplemented by the patient's mother at the bedside.  The patient recently had a drug overdose on 01/25/2022.  CPR was administered at that time.  The patient ultimately woke up after Narcan x2.  She has had persistent chest pain since that period of time.  The patient complains of chest pain but denies any shortness of breath, nausea, vomiting, diarrhea, abdominal pain. In the emergency department, the patient was afebrile and hemodynamically stable with oxygen saturation 98% room air.  WBC 9.1, hemoglobin 11.3, platelets 299,000.  Sodium 137, potassium 3.4, bicarbonate 29, serum creatinine 0.65.  CT of the chest and abdomen showed a cluster of nodular opacities in the right middle lobe concerning for atypical inflammatory changes.  There are also smaller nodules throughout the bilateral lungs concerning for infectious process.  There are displaced rib fractures of the right third and fourth ribs with a minimally displaced sternal fracture.  The patient was started on vancomycin and ceftriaxone.  Blood cultures were obtained. CT of the lumbar spine was negative for fracture.  It shows L4-5 right paracentral disc protrusion with possible right L5 nerve root impingement The patient was empirically started on ceftriaxone and vancomycin.  Her blood cultures were followed throughout the hospitalization and remained negative.  Transthoracic echocardiogram showed mobile echodensities on the ventricular side of the mitral valve which likely represent vegetations versus loose chordal apparatus.  ID was consulted.  Patient was started on postexposure prophylaxis for HIV.  She was also given postexposure prophylaxis for STI.  She was seen by [facility] nurse, but ultimately refused a full examination and investigation.  The  patient and mother were seen by [facility] and social work.  They were given numerous resources regarding substance abuse rehabilitation and outpatient shelters. The patient's mental status improved and returned to baseline.  She was treated with opioids for her sternal and rib fractures as well as lumbar pain.  She did not exhibit any signs of withdrawal.  She remained afebrile and hemodynamically stable.  The remainder of her 30-day supply for HIV postexposure prophylaxis was arranged for the patient.
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
## 2022-02-20 NOTE — Progress Notes (Signed)
Error
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
## 2022-02-21 ENCOUNTER — Encounter (HOSPITAL_COMMUNITY): Payer: Self-pay | Admitting: Anesthesiology

## 2022-02-21 ENCOUNTER — Other Ambulatory Visit (HOSPITAL_COMMUNITY): Payer: Self-pay | Admitting: *Deleted

## 2022-02-21 DIAGNOSIS — S2241XS Multiple fractures of ribs, right side, sequela: Secondary | ICD-10-CM

## 2022-02-21 LAB — CBC
HCT: 30.2 % — ABNORMAL LOW (ref 36.0–46.0)
Hemoglobin: 10.2 g/dL — ABNORMAL LOW (ref 12.0–15.0)
MCH: 30 pg (ref 26.0–34.0)
MCHC: 33.8 g/dL (ref 30.0–36.0)
MCV: 88.8 fL (ref 80.0–100.0)
Platelets: 302 10*3/uL (ref 150–400)
RBC: 3.4 MIL/uL — ABNORMAL LOW (ref 3.87–5.11)
RDW: 11.9 % (ref 11.5–15.5)
WBC: 6.2 10*3/uL (ref 4.0–10.5)
nRBC: 0 % (ref 0.0–0.2)

## 2022-02-21 LAB — URINE CULTURE

## 2022-02-21 LAB — HIV-1 RNA QUANT-NO REFLEX-BLD
HIV 1 RNA Quant: <20 copies/mL
LOG10 HIV-1 RNA: UNDETERMINED log10copy/mL

## 2022-02-21 LAB — COMPREHENSIVE METABOLIC PANEL
ALT: 15 U/L (ref 0–44)
AST: 15 U/L (ref 15–41)
Albumin: 3 g/dL — ABNORMAL LOW (ref 3.5–5.0)
Alkaline Phosphatase: 53 U/L (ref 38–126)
Anion gap: 5 (ref 5–15)
BUN: 11 mg/dL (ref 6–20)
CO2: 26 mmol/L (ref 22–32)
Calcium: 8.3 mg/dL — ABNORMAL LOW (ref 8.9–10.3)
Chloride: 106 mmol/L (ref 98–111)
Creatinine, Ser: 0.65 mg/dL (ref 0.44–1.00)
GFR, Estimated: >60 mL/min (ref >60)
Glucose, Bld: 110 mg/dL — ABNORMAL HIGH (ref 70–99)
Potassium: 4.4 mmol/L (ref 3.5–5.1)
Sodium: 137 mmol/L (ref 135–145)
Total Bilirubin: 0.6 mg/dL (ref 0.3–1.2)
Total Protein: 6.3 g/dL — ABNORMAL LOW (ref 6.5–8.1)

## 2022-02-21 LAB — RPR: RPR Ser Ql: NONREACTIVE

## 2022-02-21 LAB — URINE CYTOLOGY ANCILLARY ONLY
Chlamydia: NEGATIVE
Comment: NEGATIVE
Comment: NORMAL
Neisseria Gonorrhea: NEGATIVE

## 2022-02-21 LAB — HCV RNA QUANT RFLX ULTRA OR GENOTYP
HCV RNA Qnt(log copy/mL): UNDETERMINED log10 IU/mL
HepC Qn: NOT DETECTED IU/mL

## 2022-02-21 LAB — GC/CHLAMYDIA PROBE AMP (~~LOC~~) NOT AT ARMC
Chlamydia: NEGATIVE
Comment: NEGATIVE
Comment: NORMAL
Neisseria Gonorrhea: NEGATIVE

## 2022-02-21 LAB — MAGNESIUM: Magnesium: 2 mg/dL (ref 1.7–2.4)

## 2022-02-21 MED ORDER — HYDROMORPHONE HCL 1 MG/ML IJ SOLN
1.0000 mg | INTRAMUSCULAR | Status: DC | PRN
  Administered 2022-02-21 – 2022-02-25 (×30): 1 mg via INTRAVENOUS
  Filled 2022-02-21 (×30): qty 1

## 2022-02-21 MED ORDER — MELATONIN 3 MG PO TABS
6.0000 mg | ORAL_TABLET | Freq: Once | ORAL | Status: AC
  Administered 2022-02-21: 6 mg via ORAL
  Filled 2022-02-21: qty 2

## 2022-02-21 NOTE — Plan of Care (Signed)
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
## 2022-02-21 NOTE — Progress Notes (Addendum)
    Made aware by Dr. Domenic Polite and Dr. Carles Collet that anesthesia will not perform the patient's TEE tomorrow due to her meth use prior to admission and want to delay the procedure until Monday. Will cancel TEE for tomorrow and reschedule for Monday (time pending).   Signed, Erma Heritage, PA-C 02/21/2022, 3:13 PM   TEE scheduled for Monday at 1300 with Dr. Dellia Cloud.   Signed, Erma Heritage, PA-C 02/21/2022, 3:31 PM Pager: [phone]

## 2022-02-21 NOTE — Progress Notes (Addendum)
   [place] has been requested to perform a transesophageal echocardiogram on [facility] for abnormal TTE/rule-out endocarditis. After careful review of history and examination, the risks and benefits of transesophageal echocardiogram have been explained including risks of esophageal damage, perforation (1:10,000 risk), bleeding, pharyngeal hematoma as well as other potential complications associated with conscious sedation including aspiration, arrhythmia, respiratory failure and death. Alternatives to treatment were discussed, questions were answered. Patient is willing to proceed.  She is A&Ox3 today and able to provide consent. I asked if she wanted me to update her family about the procedure and she declined. Said she will share with them if she decides to do so. Labs show electrolytes are within a normal range. Hgb at 10.2 and platelets at 302 K.   TEE scheduled for 02/22/2022 at 0945 with anesthesia assistance given IVDU.   Signed, Erma Heritage, PA-C 02/21/2022, 11:13 AM

## 2022-02-21 NOTE — TOC Progression Note (Signed)
Transition of Care [facility]) - Progression Note    Patient Details  Name: Jasmin Conner MRN: [id number] Date of Birth: 06-03-1995  Transition of Care [facility]) CM/SW Contact  Shade Flood, LCSW Phone Number: 02/21/2022, 12:36 PM  Clinical Narrative:     TOC following. Met with pt at bedside to provide SA treatment resource information. Verbal and written information provided. Explained that pt will need to make any calls to establish herself with treatment. Pt expressed understanding. Emotional support offered to patient. MD anticipating weekend dc. TOC will follow.    Barriers to Discharge: Continued Medical Work up  Expected Discharge Plan and Services   In-house Referral: Clinical Social Work     Living arrangements for the past 2 months: Single Family Home                                       Social Determinants of Health (SDOH) Interventions    Readmission Risk Interventions     No data to display

## 2022-02-21 NOTE — Progress Notes (Signed)
Responded to nursing call:  patient wants to leave AMA   Subjective: Pt complains of chest pain and back pain.  Denies sob, n/v   Vitals:   02/20/22 1800 02/20/22 2000 02/20/22 2045 02/21/22 0028  BP: 115/72 106/70    Pulse: 75 80    Resp: 17 15    Temp:   98.2 F (36.8 C) 98.4 F (36.9 C)  TempSrc:   Oral Oral  SpO2: 100% 100%    Weight:      Height:       CV--RRR Lung--CTA Abd--soft+BS/NT   Assessment/Plan: Chest pain--due to sternal and rib fractures --start IV dilaudid prn pain --discussed risks, benefits, alternatives of leaving AMA including but not limited to sepsis, death, worsening infection In speaking with Romualdo Bolk, Nelva Hauk has demonstrated the ability to understand his medical condition(s) which include possible septic emboli.  Myelle Poteat has demonstrated the ability to appreciate how treatment for possible septic emboli will be beneficial.   Litzi Binning has also demonstrated the ability to understand and appreciate how refusal of treatement for possible septic emboli could result in harm, repeat hospitalization, and possibly death.  Estefani Bateson demonstrates the ability to reason through the risks and benefits of the proposed treatment.  Finally, Mackenzye Mackel is able to clearly communicate his/her choice.      Orson Eva, DO [facility]
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
## 2022-02-21 NOTE — Progress Notes (Addendum)
26 yo F with hx of IVDA, PTSD, recent incarceration (10-3), recent overdose (amphetamines, fentanyl)(10-6) requiring narcan. [facility] with hallucinations (auditory and visual). She admitted to fentanyl and amphetamine use on day of admission. Multiple nodular lesions on CT chest.  She remains afebrile.  She has BCx pending.  She remains on vanco/ceftriaxone.  Given Hep A /B vaccine RPR  (-) On PEP for HIV (dolutegravir-truvada) For TEE in AM.  Hep C RNA pending.  HIV RNA pending.  GC/Chlamydia pending- spoke with RN to collect.

## 2022-02-21 NOTE — Progress Notes (Addendum)
Discussed with mother Mother upset that we are not moving fast enough to get daughter taken care of medically I explained to mother that we did not want to do more harm and that the appropriate specialists (cardiology and ID) are involved in patient's care I explained that cardiology wanted to delay TEE until patient was more alert and stable I explained the plan to continue IV antibiotics and follow cultures I also explained that social work is involved and will provide resources for her daughter for substance rehab.   I updated mother on plan of care.  I also summarized what social work, Ms. Elaina Pattee has stated to her and done for her daughter including but not limited to trying to arrange for PCP, spoke with financial counselor about medicaid, and provided substance abuse resources  DTat

## 2022-02-21 NOTE — SANE Note (Signed)
02/21/2022 1430  The SANE/FNE Naval architect) consult has been completed. Dr. Carles Collet has been notified. Please contact the SANE/FNE nurse on call (listed in [place]) with any further concerns.  Patient declined evidence collection. Consented to photographs. If patient wishes to have evidence collection, she has until November 4th (Saturday) to do so. SANE team will call unit to see if patient is still wanting services. Or staff may call SANE if patient requests services.

## 2022-02-22 ENCOUNTER — Telehealth: Payer: Self-pay | Admitting: Infectious Diseases

## 2022-02-22 ENCOUNTER — Other Ambulatory Visit (HOSPITAL_COMMUNITY): Payer: Self-pay

## 2022-02-22 ENCOUNTER — Telehealth (HOSPITAL_COMMUNITY): Payer: Self-pay | Admitting: Pharmacy Technician

## 2022-02-22 LAB — VANCOMYCIN, TROUGH: Vancomycin Tr: 7 ug/mL — ABNORMAL LOW (ref 15–20)

## 2022-02-22 MED ORDER — VANCOMYCIN HCL IN DEXTROSE 1-5 GM/200ML-% IV SOLN: 1000.0000 mg | Freq: Two times a day (BID) | INTRAVENOUS | Status: DC

## 2022-02-22 MED ORDER — VANCOMYCIN HCL IN DEXTROSE 1-5 GM/200ML-% IV SOLN
1000.0000 mg | Freq: Two times a day (BID) | INTRAVENOUS | Status: DC
  Administered 2022-02-22 – 2022-02-24 (×6): 1000 mg via INTRAVENOUS
  Filled 2022-02-22 (×6): qty 200

## 2022-02-22 MED ORDER — ELVITEG-COBIC-EMTRICIT-TENOFAF 150-150-200-10 MG PO TABS
1.0000 | ORAL_TABLET | Freq: Every day | ORAL | Status: DC
  Administered 2022-02-23 – 2022-02-25 (×3): 1 via ORAL
  Filled 2022-02-22 (×5): qty 1

## 2022-02-22 MED ORDER — ELVITEG-COBIC-EMTRICIT-TENOFAF 150-150-200-10 MG PO TABS
1.0000 | ORAL_TABLET | Freq: Every day | ORAL | 0 refills | Status: AC
  Filled 2022-02-22: qty 30, 30d supply, fill #0

## 2022-02-22 NOTE — Progress Notes (Addendum)
   26 yo F with hx of IVDA, PTSD, recent incarceration (10-3), recent overdose (amphetamines, fentanyl)(10-6) requiring narcan. [facility] with hallucinations (auditory and visual). She admitted to fentanyl and amphetamine use on day of admission. Multiple nodular lesions on CT chest.  She remains afebrile. BP stable after some lower BP in the afternoon yesterday.  She has BCx pending.  She remains on vanco/ceftriaxone. Could consider her lung lesions from injection source/material if BCx (-).   Given Hep A /B vaccine RPR  (-) GC/Chlamydia (-), she was given PEP for this.  On PEP for HIV (dolutegravir-truvada)- aim for 1 month.  TEE pending.  Hep C RNA (-), she has been previously treated and cured.Marland Kitchen  HIV RNA (-) Plan to continue PEP/truvada-dolutegravir --> genvoya (ease of dosing) for 1 month   Addendum- Called pt's mother and updated her.

## 2022-02-22 NOTE — Progress Notes (Addendum)
Pharmacy Antibiotic Note  Jasmin Conner is a 26 y.o. female admitted on 02/19/2022 with  r/o septic emboli .  Pharmacy has been consulted for vanc dosing.  Pt with know hx of drug abuse. She presented with AMS and sternal pain. CT showed possible septic emboli. Will start empiric vanc/ceftriaxone.  Scr 0.65  VT 7, subtherapeutic  Plan: Vanc 1000mg  IV q12 Ceftriaxone 2g IV q24 Levels as needed  Height: 5\' 6"  (167.6 cm) Weight: 54.1 kg (119 lb 4.8 oz) IBW/kg (Calculated) : 59.3  Temp (24hrs), Avg:98.6 F (37 C), Min:98.1 F (36.7 C), Max:99.3 F (37.4 C)  Recent Labs  Lab 02/19/22 1235 02/20/22 0337 02/21/22 0300 02/22/22 0745  WBC 9.1 7.5 6.2  --   CREATININE 0.71 0.65 0.65  --   VANCOTROUGH  --   --   --  9*     Estimated Creatinine Clearance: 90.2 mL/min (by C-G formula based on SCr of 0.65 mg/dL).    Allergies  Allergen Reactions   Codeine    Decadron [Dexamethasone]     Antimicrobials this admission: 10/31 vanc>> 10/31 ceftriaxone>>  Dose adjustments this admission:   Microbiology results: 11/31 blood>>NGTD 10/31 Ucx: MSP   [name], PharmD, [facility] Clinical Pharmacist  02/22/2022 8:17 AM

## 2022-02-22 NOTE — Telephone Encounter (Signed)
Patient's mother is calling in requesting a call back from Dr. Johnnye Sima.  States she spoke with him yesterday and had another question she needed to ask.  Forwarding to triage.
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
## 2022-02-22 NOTE — Telephone Encounter (Signed)
Patient Advocate Encounter  Completed and sent [employer] Advancing Access application for [place] for this patient who is uninsured.      BIN      [id number] PCN    [id number]  GRP    [id number] ID        [id number]     Lyndel Safe, [place] Patient Advocate Specialist [facility] Patient Advocate Team Direct Number: [phone] Fax: [phone]
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
## 2022-02-23 NOTE — TOC Progression Note (Signed)
Transition of Care [facility]) - Progression Note    Patient Details  Name: Jasmin Conner MRN: [id number] Date of Birth: [birth year]  Transition of Care [facility]) CM/SW Contact  Shade Flood, LCSW Phone Number: 02/23/2022, 11:46 AM  Clinical Narrative:     [facility] consult received for disposition needs at dc. TOC had previously provided shelter resource information to pt/family. Will follow up again with pt prior to dc to re-issue this information.     Barriers to Discharge: Continued Medical Work up  Expected Discharge Plan and Services   In-house Referral: Clinical Social Work     Living arrangements for the past 2 months: Single Family Home                                       Social Determinants of Health (SDOH) Interventions    Readmission Risk Interventions     No data to display

## 2022-02-23 NOTE — Consult Note (Signed)
The SANE/FNE (Forensic Nurse Examiner) consult has been completed. The primary RN and/or provider have been notified. Please contact the SANE/FNE nurse on call (listed in Amion) with any further concerns.  

## 2022-02-23 NOTE — Progress Notes (Signed)
Patient has rested well this shift. No new issues or complaints with patient. Vitals have been stable and patient has been medicated per her requests this shift.  Patient does express concerns about her living situation.

## 2022-02-23 NOTE — Progress Notes (Signed)
Transport planner ( sane nurse Lenna Sciara) did come to room and see patient.  Her and patient had a talk, about procedure and patient eventually did refuse the Sane kit/swab again.  According to sane nurse, patient is unsure of events and did not want to proceed any further.  Patient is alert and oriented x 4.
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
## 2022-02-24 LAB — CULTURE, BLOOD (ROUTINE X 2)
Culture: NO GROWTH
Culture: NO GROWTH
Culture: NO GROWTH
Culture: NO GROWTH
Special Requests: ADEQUATE
Special Requests: ADEQUATE
Special Requests: ADEQUATE
Special Requests: ADEQUATE

## 2022-02-24 MED ORDER — MELATONIN 3 MG PO TABS
6.0000 mg | ORAL_TABLET | Freq: Once | ORAL | Status: AC
  Administered 2022-02-24: 6 mg via ORAL
  Filled 2022-02-24: qty 2

## 2022-02-24 NOTE — SANE Note (Signed)
The SANE/FNE Naval architect) consult has been completed. The primary RN and/or provider have been notified. Please contact the SANE/FNE nurse on call (listed in [place]) with any further concerns.  The patient's RN, Valentino Nose, has been made aware the patient does not wish to have any of her discussed with her mother. The hospitalist on the floor is aware the patient would like a social work consult. He will review the chart and discuss the consult with her attending doctor in the morning.
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
## 2022-02-25 ENCOUNTER — Other Ambulatory Visit (HOSPITAL_COMMUNITY): Payer: Self-pay

## 2022-02-25 LAB — COMPREHENSIVE METABOLIC PANEL
ALT: 18 U/L (ref 0–44)
AST: 18 U/L (ref 15–41)
Albumin: 3.1 g/dL — ABNORMAL LOW (ref 3.5–5.0)
Alkaline Phosphatase: 61 U/L (ref 38–126)
Anion gap: 7 (ref 5–15)
BUN: 8 mg/dL (ref 6–20)
CO2: 26 mmol/L (ref 22–32)
Calcium: 8.5 mg/dL — ABNORMAL LOW (ref 8.9–10.3)
Chloride: 104 mmol/L (ref 98–111)
Creatinine, Ser: 0.73 mg/dL (ref 0.44–1.00)
GFR, Estimated: >60 mL/min (ref >60)
Glucose, Bld: 105 mg/dL — ABNORMAL HIGH (ref 70–99)
Potassium: 3.7 mmol/L (ref 3.5–5.1)
Sodium: 137 mmol/L (ref 135–145)
Total Bilirubin: 0.4 mg/dL (ref 0.3–1.2)
Total Protein: 6.6 g/dL (ref 6.5–8.1)

## 2022-02-25 LAB — CBC
HCT: 35.2 % — ABNORMAL LOW (ref 36.0–46.0)
Hemoglobin: 11.5 g/dL — ABNORMAL LOW (ref 12.0–15.0)
MCH: 29.4 pg (ref 26.0–34.0)
MCHC: 32.7 g/dL (ref 30.0–36.0)
MCV: 90 fL (ref 80.0–100.0)
Platelets: 305 10*3/uL (ref 150–400)
RBC: 3.91 MIL/uL (ref 3.87–5.11)
RDW: 11.9 % (ref 11.5–15.5)
WBC: 10.8 10*3/uL — ABNORMAL HIGH (ref 4.0–10.5)
nRBC: 0 % (ref 0.0–0.2)

## 2022-02-25 LAB — MAGNESIUM: Magnesium: 2 mg/dL (ref 1.7–2.4)

## 2022-02-25 SURGERY — ECHOCARDIOGRAM, TRANSESOPHAGEAL
Anesthesia: Monitor Anesthesia Care

## 2022-02-25 MED ORDER — OXYCODONE-ACETAMINOPHEN 5-325 MG PO TABS: 1.0000 | ORAL_TABLET | Freq: Four times a day (QID) | ORAL | 0 refills | Status: AC | PRN

## 2022-02-25 MED ORDER — OXYCODONE-ACETAMINOPHEN 5-325 MG PO TABS: 1.0000 | ORAL_TABLET | Freq: Four times a day (QID) | ORAL | Status: DC | PRN
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
## 2022-02-25 NOTE — Progress Notes (Signed)
Pt asked phlebotomy to come back for bloodwork at 8am

## 2022-02-25 NOTE — Progress Notes (Signed)
Genvoya filled by [facility] delivered to patient's room

## 2022-02-25 NOTE — Progress Notes (Signed)
Patient discharged. Mother present for discharge teaching and to provide safe transportation. Patient given medication script and Genvoya.

## 2022-03-01 ENCOUNTER — Telehealth: Payer: Self-pay

## 2022-03-01 NOTE — Telephone Encounter (Signed)
Attempted to reach out to patient about Care Connect program after receiving a referral from [facility] social worker. No answer, left voicemail. Was told during referral that the patient is looking for suboxone. This medication is not covered through [employer], a program for uninsured patients to receive free pharmacy.

## 2022-06-21 ENCOUNTER — Other Ambulatory Visit: Payer: Self-pay

## 2022-06-26 ENCOUNTER — Other Ambulatory Visit (HOSPITAL_COMMUNITY): Payer: Self-pay

## 2022-12-04 ENCOUNTER — Telehealth: Payer: Self-pay | Admitting: *Deleted

## 2022-12-04 NOTE — Telephone Encounter (Signed)
Dr. Francis Dowse office contacted to clarify need for TEE through cardiologist here. Spoke with Dr. Marni Griffon who says he does not do TEE's and did not request the TEE be done by our cardiologist but says the patient and mother request to have TEE done by [facility] cardiologist. Per Dr. Marni Griffon, he has only seen this patient once and he would appreciate if we could follow this patient for TEE. Advised that patient has been set up to see our cardiologist to evaluate and arrange TEE if appropriate. Appointment is 12/17/2022 @9 :20 am. Patient and mother aware of appointment.

## 2022-12-05 ENCOUNTER — Ambulatory Visit: Payer: Self-pay | Admitting: Nurse Practitioner

## 2022-12-17 ENCOUNTER — Ambulatory Visit: Payer: Medicaid Other | Admitting: Internal Medicine

## 2022-12-31 ENCOUNTER — Ambulatory Visit: Payer: Medicaid Other | Attending: Internal Medicine | Admitting: Internal Medicine

## 2022-12-31 NOTE — Progress Notes (Signed)
Erroneous encounter - please disregard.

## 2023-06-18 ENCOUNTER — Other Ambulatory Visit (HOSPITAL_COMMUNITY): Payer: Self-pay

## 2023-11-07 ENCOUNTER — Encounter: Payer: Self-pay | Admitting: Advanced Practice Midwife
